# Patient Record
Sex: Female | Born: 1955
Health system: Southern US, Community
[De-identification: ages and names within clinical notes are randomized; demographics above are authoritative.]

## PROBLEM LIST (undated history)

## (undated) ENCOUNTER — Emergency Department: Discharge status: Absent without leave | Payer: Self-pay

## (undated) ENCOUNTER — Inpatient Hospital Stay: Admission: EM | Payer: Self-pay | Source: Home / Self Care

## (undated) DIAGNOSIS — E785 Hyperlipidemia, unspecified: Secondary | ICD-10-CM

## (undated) DIAGNOSIS — F329 Major depressive disorder, single episode, unspecified: Secondary | ICD-10-CM

## (undated) DIAGNOSIS — F32A Depression, unspecified: Secondary | ICD-10-CM

## (undated) HISTORY — DX: Major depressive disorder, single episode, unspecified: F32.9

## (undated) HISTORY — DX: Depression, unspecified: F32.A

## (undated) HISTORY — PX: TONSILLECTOMY AND ADENOIDECTOMY: SUR1326

## (undated) HISTORY — DX: Hyperlipidemia, unspecified: E78.5

---

## 2006-04-23 ENCOUNTER — Emergency Department: Payer: Self-pay | Admitting: Emergency Medicine

## 2006-10-13 ENCOUNTER — Ambulatory Visit: Payer: Self-pay

## 2007-10-18 ENCOUNTER — Ambulatory Visit: Payer: Self-pay

## 2008-10-17 ENCOUNTER — Ambulatory Visit: Payer: Self-pay

## 2009-10-18 ENCOUNTER — Ambulatory Visit: Payer: Self-pay | Admitting: Internal Medicine

## 2010-12-10 ENCOUNTER — Ambulatory Visit: Payer: Self-pay

## 2010-12-24 ENCOUNTER — Ambulatory Visit: Payer: Self-pay

## 2012-01-06 ENCOUNTER — Ambulatory Visit: Payer: Self-pay | Admitting: Family Medicine

## 2013-03-29 ENCOUNTER — Ambulatory Visit: Payer: Self-pay

## 2014-05-23 ENCOUNTER — Ambulatory Visit: Payer: Self-pay

## 2014-06-04 ENCOUNTER — Ambulatory Visit: Payer: Self-pay

## 2014-06-04 ENCOUNTER — Encounter: Payer: Self-pay | Admitting: *Deleted

## 2014-06-12 ENCOUNTER — Ambulatory Visit (INDEPENDENT_AMBULATORY_CARE_PROVIDER_SITE_OTHER): Payer: PRIVATE HEALTH INSURANCE | Admitting: General Surgery

## 2014-06-12 ENCOUNTER — Encounter: Payer: Self-pay | Admitting: General Surgery

## 2014-06-12 VITALS — BP 130/74 | HR 76 | Resp 14 | Ht 61.0 in | Wt 192.0 lb

## 2014-06-12 DIAGNOSIS — D1730 Benign lipomatous neoplasm of skin and subcutaneous tissue of unspecified sites: Secondary | ICD-10-CM

## 2014-06-12 NOTE — Progress Notes (Signed)
Patient ID: Abigail Williams, female   DOB: 09-Apr-1956, 59 y.o.   MRN: [id number]  Chief Complaint  Patient presents with  . Other    mammogram    HPI Abigail Williams is a 59 y.o. female who presents for a breast evaluation. The most recent mammogram and right breast ultrasound was done on 06/04/14. Patient states she felt a spot in her right beast at 11:00 about two months ago. She states the area has got smaller and does not feel it now.  Patient does perform regular self breast checks and gets regular mammograms done.    HPI  Past Medical History  Diagnosis Date  . Hyperlipidemia   . Depression     Past Surgical History  Procedure Laterality Date  . Tonsillectomy and adenoidectomy  age 46    Family History  Problem Relation Age of Onset  . Liver cancer Sister 57    Social History History  Substance Use Topics  . Smoking status: Never Smoker   . Smokeless tobacco: Not on file  . Alcohol Use: 0.0 oz/week    0 Not specified per week    No Known Allergies  Current Outpatient Prescriptions  Medication Sig Dispense Refill  . Multiple Vitamins-Minerals (MULTIVITAMIN WITH MINERALS) tablet Take 1 tablet by mouth daily.    Marland Kitchen PARoxetine (PAXIL) 30 MG tablet Take 30 mg by mouth daily.    . pravastatin (PRAVACHOL) 20 MG tablet Take 20 mg by mouth daily.     No current facility-administered medications for this visit.    Review of Systems Review of Systems  Constitutional: Negative.   Respiratory: Negative.   Cardiovascular: Negative.     Blood pressure 130/74, pulse 76, resp. rate 14, height 5\' 1"  (1.549 m), weight 192 lb (87.091 kg).  Physical Exam Physical Exam  Constitutional: She is oriented to person, place, and time. She appears well-developed and well-nourished.  Eyes: Conjunctivae are normal. No scleral icterus.  Neck: Neck supple.  Cardiovascular: Normal rate, regular rhythm and normal heart sounds.   Pulmonary/Chest: Right breast exhibits no inverted nipple, no  mass, no nipple discharge, no skin change and no tenderness. Left breast exhibits no inverted nipple, no mass, no nipple discharge, no skin change and no tenderness.  Abdominal: Soft. Bowel sounds are normal. There is no tenderness.  Lymphadenopathy:    She has no cervical adenopathy.    She has no axillary adenopathy.  Neurological: She is alert and oriented to person, place, and time.  Skin: Skin is warm and dry.  5 mm slightly firm nodule 2 cm firm right below the right clavicle subcutaneous.  Data Reviewed Notes, mammogram. Ultrasound reviewed- tiny hyperechoic subcutaneous mass- suggestive of a LIPOMA.  Assessment  Lipoma right anterior chest wall below clavicle. Doe not warrant excision at present. Pt advised to call if it enlarges or causes symptoms. She does not need f/u US unless there is a noteable change.    Plan    Patient to return in four months for recheck.        SANKAR,SEEPLAPUTHUR G 06/12/2014, 2:16 PM

## 2014-11-05 ENCOUNTER — Ambulatory Visit (INDEPENDENT_AMBULATORY_CARE_PROVIDER_SITE_OTHER): Payer: PRIVATE HEALTH INSURANCE | Admitting: General Surgery

## 2014-11-05 ENCOUNTER — Encounter: Payer: Self-pay | Admitting: General Surgery

## 2014-11-05 VITALS — BP 146/72 | HR 74 | Resp 12 | Ht 59.0 in | Wt 186.0 lb

## 2014-11-05 DIAGNOSIS — L237 Allergic contact dermatitis due to plants, except food: Secondary | ICD-10-CM | POA: Diagnosis not present

## 2014-11-05 DIAGNOSIS — D1730 Benign lipomatous neoplasm of skin and subcutaneous tissue of unspecified sites: Secondary | ICD-10-CM

## 2014-11-05 MED ORDER — PREDNISONE 10 MG (21) PO TBPK: 10.0000 mg | ORAL_TABLET | Freq: Every day | ORAL | Status: AC

## 2014-11-05 NOTE — Progress Notes (Signed)
Patient ID: Abigail Williams, female   DOB: 25-Nov-1955, 59 y.o.   MRN: [id number]  Chief Complaint  Patient presents with  . Follow-up    Lipoma    HPI Alfonso Shackett is a 59 y.o. female here today for follow up on lipoma on chest wall. She states she cannot find the lipoma. She does have poison ivy.  HPI  Past Medical History  Diagnosis Date  . Hyperlipidemia   . Depression     Past Surgical History  Procedure Laterality Date  . Tonsillectomy and adenoidectomy  age 50    Family History  Problem Relation Age of Onset  . Liver cancer Sister 70  . Cancer Sister     pancreatic    Social History History  Substance Use Topics  . Smoking status: Never Smoker   . Smokeless tobacco: Not on file  . Alcohol Use: 0.0 oz/week    0 Standard drinks or equivalent per week    No Known Allergies  Current Outpatient Prescriptions  Medication Sig Dispense Refill  . Multiple Vitamins-Minerals (MULTIVITAMIN WITH MINERALS) tablet Take 1 tablet by mouth daily.    Marland Kitchen PARoxetine (PAXIL) 30 MG tablet Take 30 mg by mouth daily.    . pravastatin (PRAVACHOL) 20 MG tablet Take 20 mg by mouth daily.     No current facility-administered medications for this visit.    Review of Systems Review of Systems  Constitutional: Negative.   Respiratory: Negative.   Cardiovascular: Negative.     Blood pressure 146/72, pulse 74, resp. rate 12, height 4\' 11"  (1.499 m), weight 186 lb (84.369 kg).  Physical Exam Physical Exam  Constitutional: She is oriented to person, place, and time. She appears well-developed and well-nourished.  Eyes: Conjunctivae are normal. No scleral icterus.  Neck: Neck supple.  Pulmonary/Chest: Effort normal.  Musculoskeletal:  Previous 5 mm subcutaneous nodule below right clavicle is no longer palpable.   Lymphadenopathy:    She has no cervical adenopathy.  Neurological: She is alert and oriented to person, place, and time.  Skin: Skin is warm and dry. Rash (poison ivy over left  forearm) noted.    Data Reviewed Prior Notes   Assessment    Previous 5 mm subcutaneous nodule below right clavicle is no longer palpable.  Poison Ivy on left forearm     Plan    Rx Prednisone Dose Pack The patient is aware to call back for any questions or concerns.  Follow up as needed      IBB:[id number]  Verlene Mayer 11/05/2014, 10:51 AM

## 2014-11-05 NOTE — Patient Instructions (Signed)
The patient is aware to call back for any questions or concerns. Calling in Rx for prednisone dose pack to pharmacy.

## 2014-11-08 ENCOUNTER — Ambulatory Visit: Payer: PRIVATE HEALTH INSURANCE | Admitting: General Surgery

## 2014-12-10 ENCOUNTER — Other Ambulatory Visit: Payer: Self-pay | Admitting: *Deleted

## 2014-12-10 DIAGNOSIS — N63 Unspecified lump in unspecified breast: Secondary | ICD-10-CM

## 2014-12-12 ENCOUNTER — Ambulatory Visit
Admission: RE | Admit: 2014-12-12 | Discharge: 2014-12-12 | Disposition: A | Discharge status: Home / Self Care | Payer: Self-pay | Source: Ambulatory Visit | Attending: Oncology | Admitting: Oncology

## 2014-12-12 DIAGNOSIS — N63 Unspecified lump in unspecified breast: Secondary | ICD-10-CM

## 2014-12-13 ENCOUNTER — Encounter: Payer: Self-pay | Admitting: *Deleted

## 2014-12-13 NOTE — Progress Notes (Signed)
Letter mailed from the [facility] to inform patient of her normal ultrasound results.  Patient is to follow-up with annual screening in six months.

## 2015-06-10 ENCOUNTER — Ambulatory Visit: Payer: Self-pay | Attending: Oncology | Admitting: *Deleted

## 2015-06-10 ENCOUNTER — Encounter: Payer: Self-pay | Admitting: *Deleted

## 2015-06-10 ENCOUNTER — Encounter (INDEPENDENT_AMBULATORY_CARE_PROVIDER_SITE_OTHER): Payer: Self-pay

## 2015-06-10 VITALS — BP 122/77 | HR 87 | Temp 97.0°F | Ht 63.0 in | Wt 184.0 lb

## 2015-06-10 DIAGNOSIS — N63 Unspecified lump in unspecified breast: Secondary | ICD-10-CM

## 2015-06-10 NOTE — Patient Instructions (Signed)
Gave patient hand-out, Women Staying Healthy, Active and Well from [facility], with education on breast health, pap smears, heart and colon health. 

## 2015-06-10 NOTE — Progress Notes (Signed)
Subjective:     Patient ID: Abigail Williams, female   DOB: 05/08/1956, 60 y.o.   MRN: [id number]  HPI   Review of Systems     Objective:   Physical Exam  Pulmonary/Chest: Right breast exhibits no inverted nipple, no mass, no nipple discharge, no skin change and no tenderness. Left breast exhibits no inverted nipple, no mass, no nipple discharge, no skin change and no tenderness. Breasts are symmetrical.  Scattered areas of keratotic lesions across the breast and chest       Assessment:     60 year old White female returns to [facility] for annual screening.   Patient had a palpable  superficial nodule right breast on physical exam.  Birads 3 mammogram.  Patient was followed by Dr. Jamal Collin and the area of concern was at his 4 month follow up.  No 6 month follow up mammogram completed. On clinical breast exam today there is no palpable finding.  Taught self breast awareness.  Patient has been screened for eligibility.  She does not have any insurance, Medicare or Medicaid.  She also meets financial eligibility.  Hand-out given on the Affordable Care Act.      Plan:     Bilateral diagnostic mammogram and ultrasound ordered per Radiology protocol since birads 3 mammo had no follow-up.  Patient scheduled for 06/25/15 @ 11:20.  Will follow-up per [facility] protocol.

## 2015-06-25 ENCOUNTER — Encounter: Payer: Self-pay | Admitting: *Deleted

## 2015-06-25 ENCOUNTER — Ambulatory Visit
Admission: RE | Admit: 2015-06-25 | Discharge: 2015-06-25 | Disposition: A | Discharge status: Home / Self Care | Payer: Self-pay | Source: Ambulatory Visit | Attending: Oncology | Admitting: Oncology

## 2015-06-25 DIAGNOSIS — N63 Unspecified lump in unspecified breast: Secondary | ICD-10-CM

## 2015-06-25 NOTE — Progress Notes (Signed)
Letter mailed from the [facility] to inform patient of her normal mammogram results.  Patient is to follow-up with annual screening in one year.  HSIS to Christy. 

## 2016-06-24 ENCOUNTER — Encounter: Payer: Self-pay | Admitting: *Deleted

## 2016-06-24 ENCOUNTER — Ambulatory Visit
Admission: RE | Admit: 2016-06-24 | Discharge: 2016-06-24 | Disposition: A | Discharge status: Home / Self Care | Payer: Self-pay | Source: Ambulatory Visit | Attending: Oncology | Admitting: Oncology

## 2016-06-24 ENCOUNTER — Ambulatory Visit: Payer: Self-pay | Attending: Oncology | Admitting: *Deleted

## 2016-06-24 ENCOUNTER — Encounter (INDEPENDENT_AMBULATORY_CARE_PROVIDER_SITE_OTHER): Payer: Self-pay

## 2016-06-24 VITALS — BP 131/84 | HR 82 | Temp 98.2°F | Resp 18 | Ht 59.0 in | Wt 185.1 lb

## 2016-06-24 DIAGNOSIS — Z0000 Encounter for general adult medical examination without abnormal findings: Secondary | ICD-10-CM

## 2016-06-24 NOTE — Progress Notes (Signed)
Subjective:     Patient ID: Abigail Williams, female   DOB: [birth year], 61 y.o.   MRN: [id number]  HPI   Review of Systems     Objective:   Physical Exam  Pulmonary/Chest: Right breast exhibits no inverted nipple, no mass, no nipple discharge, no skin change and no tenderness. Left breast exhibits no inverted nipple, no mass, no nipple discharge, no skin change and no tenderness. Breasts are asymmetrical.  Left breast larger than the right       Assessment:     61 year old White female returns to [facility] for annual screening.  Clinical breast exam unremarkable.  Taught self breast awareness.  Last pap on 05/28/14 was -/-.  Next pap due in 2021.  Patient refused pelvic exam today.  Encouraged pelvic exam next year even without a pap smear.  Patient has been screened for eligibility.  She does not have any insurance, Medicare or Medicaid.  She also meets financial eligibility.  Hand-out given on the Affordable Care Act.    Plan:     Screening mammogram ordered.  Will follow-up per [facility] protocol.

## 2016-06-24 NOTE — Patient Instructions (Signed)
Gave patient hand-out, Women Staying Healthy, Active and Well from [facility], with education on breast health, pap smears, heart and colon health. 

## 2016-07-01 ENCOUNTER — Encounter: Payer: Self-pay | Admitting: *Deleted

## 2016-07-01 NOTE — Progress Notes (Signed)
Letter mailed from the [facility] to inform patient of her normal mammogram results.  Patient is to follow-up with annual screening in one year.  HSIS to Christy. 

## 2017-07-14 ENCOUNTER — Ambulatory Visit
Admission: RE | Admit: 2017-07-14 | Discharge: 2017-07-14 | Disposition: A | Discharge status: Home / Self Care | Payer: Self-pay | Source: Ambulatory Visit | Attending: Oncology | Admitting: Oncology

## 2017-07-14 ENCOUNTER — Ambulatory Visit: Payer: Self-pay | Attending: Oncology | Admitting: *Deleted

## 2017-07-14 VITALS — BP 118/75 | HR 70 | Temp 98.4°F | Ht 61.0 in | Wt 184.0 lb

## 2017-07-14 DIAGNOSIS — Z0000 Encounter for general adult medical examination without abnormal findings: Secondary | ICD-10-CM

## 2017-07-14 NOTE — Patient Instructions (Signed)
Gave patient hand-out, Women Staying Healthy, Active and Well from [facility], with education on breast health, pap smears, heart and colon health. 

## 2017-07-14 NOTE — Progress Notes (Signed)
Subjective:     Patient ID: Abigail Williams, female   DOB: April 23, 1956, 62 y.o.   MRN: [id number]  HPI   Review of Systems     Objective:   Physical Exam  Pulmonary/Chest: Right breast exhibits no inverted nipple, no mass, no nipple discharge, no skin change and no tenderness. Left breast exhibits no inverted nipple, no mass, no nipple discharge, no skin change and no tenderness. Breasts are symmetrical.         Assessment:     62 year old White female returns to [facility] for annual screening.  Clinical breast exam unremarkable.  Taught self breast awareness.  Last pap on 05/23/14 was negative / negative.  See results in [place].  Next pap in 2021. Patient has been screened for eligibility.  She does not have any insurance, Medicare or Medicaid.  She also meets financial eligibility.  Hand-out given on the Affordable Care Act.    Plan:     Screening mammogram ordered.  Will follow-up per [facility] protocol.

## 2017-08-03 ENCOUNTER — Encounter: Payer: Self-pay | Admitting: *Deleted

## 2017-08-03 NOTE — Progress Notes (Signed)
Letter mailed from the [facility] to inform patient of her normal mammogram results.  Patient is to follow-up with annual screening in one year.  HSIS to Christy. 

## 2018-07-20 ENCOUNTER — Encounter (INDEPENDENT_AMBULATORY_CARE_PROVIDER_SITE_OTHER): Payer: Self-pay

## 2018-07-20 ENCOUNTER — Ambulatory Visit: Payer: Self-pay | Attending: Oncology | Admitting: *Deleted

## 2018-07-20 ENCOUNTER — Ambulatory Visit
Admission: RE | Admit: 2018-07-20 | Discharge: 2018-07-20 | Disposition: A | Discharge status: Home / Self Care | Payer: Self-pay | Source: Ambulatory Visit | Attending: Oncology | Admitting: Oncology

## 2018-07-20 ENCOUNTER — Other Ambulatory Visit: Payer: Self-pay

## 2018-07-20 ENCOUNTER — Encounter: Payer: Self-pay | Admitting: *Deleted

## 2018-07-20 VITALS — BP 134/84 | HR 87 | Temp 98.4°F | Ht 60.0 in | Wt 187.6 lb

## 2018-07-20 DIAGNOSIS — Z0000 Encounter for general adult medical examination without abnormal findings: Secondary | ICD-10-CM

## 2018-07-20 NOTE — Patient Instructions (Signed)
Gave patient hand-out, Women Staying Healthy, Active and Well from [facility], with education on breast health, pap smears, heart and colon health. 

## 2018-07-20 NOTE — Progress Notes (Signed)
  Subjective:     Patient ID: Abigail Williams, female   DOB: [birth year], 63 y.o.   MRN: [id number]  HPI   Review of Systems     Objective:   Physical Exam Chest:     Breasts:        Right: No swelling, bleeding, inverted nipple, mass, nipple discharge, skin change or tenderness.        Left: No swelling, bleeding, inverted nipple, mass, nipple discharge, skin change or tenderness.    Lymphadenopathy:     Upper Body:     Right upper body: No supraclavicular or axillary adenopathy.     Left upper body: No supraclavicular or axillary adenopathy.        Assessment:     63 year old White female presents to [facility] for annual screening.  Clinical breast exam unremarkable.  Taught self breast awareness.  Last pap on 05/23/14 was negative / negative.  Next pap due in 2021.  Patient will be 63 at that time.  Recommended she schedule a pap with her primary care provider.  Patient has been screened for eligibility.  She does not have any insurance, Medicare or Medicaid.  She also meets financial eligibility.  Hand-out given on the Affordable Care Act.   Risk Assessment    Risk Scores      07/20/2018   Last edited by: Orson Slick, CMA   5-year risk: 2 %   Lifetime risk: 7.9 %            Plan:     Screening mammogram ordered.  Will follow-up per [facility] protocol.

## 2018-07-25 ENCOUNTER — Encounter: Payer: Self-pay | Admitting: *Deleted

## 2018-07-25 NOTE — Progress Notes (Signed)
Letter mailed from the [facility] to inform patient of her normal mammogram results.  Patient is to follow-up with annual screening in one year.  HSIS to Christy. 

## 2018-09-20 DIAGNOSIS — E785 Hyperlipidemia, unspecified: Secondary | ICD-10-CM | POA: Diagnosis not present

## 2018-09-26 DIAGNOSIS — F3342 Major depressive disorder, recurrent, in full remission: Secondary | ICD-10-CM | POA: Diagnosis not present

## 2018-09-26 DIAGNOSIS — E785 Hyperlipidemia, unspecified: Secondary | ICD-10-CM | POA: Diagnosis not present

## 2018-12-08 DIAGNOSIS — H524 Presbyopia: Secondary | ICD-10-CM | POA: Diagnosis not present

## 2018-12-08 DIAGNOSIS — H353131 Nonexudative age-related macular degeneration, bilateral, early dry stage: Secondary | ICD-10-CM | POA: Diagnosis not present

## 2019-01-24 DIAGNOSIS — E785 Hyperlipidemia, unspecified: Secondary | ICD-10-CM | POA: Diagnosis not present

## 2019-01-24 DIAGNOSIS — F3342 Major depressive disorder, recurrent, in full remission: Secondary | ICD-10-CM | POA: Diagnosis not present

## 2019-02-08 DIAGNOSIS — Z0000 Encounter for general adult medical examination without abnormal findings: Secondary | ICD-10-CM | POA: Diagnosis not present

## 2019-02-08 DIAGNOSIS — E785 Hyperlipidemia, unspecified: Secondary | ICD-10-CM | POA: Diagnosis not present

## 2019-02-08 DIAGNOSIS — Z780 Asymptomatic menopausal state: Secondary | ICD-10-CM | POA: Diagnosis not present

## 2019-02-08 DIAGNOSIS — Z1159 Encounter for screening for other viral diseases: Secondary | ICD-10-CM | POA: Diagnosis not present

## 2019-02-08 DIAGNOSIS — F3342 Major depressive disorder, recurrent, in full remission: Secondary | ICD-10-CM | POA: Diagnosis not present

## 2019-02-08 DIAGNOSIS — Z8249 Family history of ischemic heart disease and other diseases of the circulatory system: Secondary | ICD-10-CM | POA: Diagnosis not present

## 2019-02-08 DIAGNOSIS — Z1211 Encounter for screening for malignant neoplasm of colon: Secondary | ICD-10-CM | POA: Diagnosis not present

## 2019-02-09 ENCOUNTER — Other Ambulatory Visit: Payer: Self-pay | Admitting: Family Medicine

## 2019-02-09 DIAGNOSIS — Z8249 Family history of ischemic heart disease and other diseases of the circulatory system: Secondary | ICD-10-CM

## 2019-02-09 DIAGNOSIS — Z0000 Encounter for general adult medical examination without abnormal findings: Secondary | ICD-10-CM

## 2019-02-09 DIAGNOSIS — M810 Age-related osteoporosis without current pathological fracture: Secondary | ICD-10-CM | POA: Diagnosis not present

## 2019-02-16 ENCOUNTER — Ambulatory Visit: Payer: Self-pay

## 2019-02-23 DIAGNOSIS — Z1211 Encounter for screening for malignant neoplasm of colon: Secondary | ICD-10-CM | POA: Diagnosis not present

## 2019-04-21 DIAGNOSIS — M810 Age-related osteoporosis without current pathological fracture: Secondary | ICD-10-CM | POA: Diagnosis not present

## 2019-06-14 ENCOUNTER — Other Ambulatory Visit: Payer: Self-pay | Admitting: Family Medicine

## 2019-06-14 DIAGNOSIS — Z1231 Encounter for screening mammogram for malignant neoplasm of breast: Secondary | ICD-10-CM

## 2019-06-28 DIAGNOSIS — H25813 Combined forms of age-related cataract, bilateral: Secondary | ICD-10-CM | POA: Diagnosis not present

## 2019-06-28 DIAGNOSIS — H353131 Nonexudative age-related macular degeneration, bilateral, early dry stage: Secondary | ICD-10-CM | POA: Diagnosis not present

## 2019-06-28 DIAGNOSIS — H524 Presbyopia: Secondary | ICD-10-CM | POA: Diagnosis not present

## 2019-07-21 ENCOUNTER — Ambulatory Visit
Admission: RE | Admit: 2019-07-21 | Discharge: 2019-07-21 | Disposition: A | Discharge status: Home / Self Care | Payer: Medicare HMO | Source: Ambulatory Visit | Attending: Family Medicine | Admitting: Family Medicine

## 2019-07-21 DIAGNOSIS — Z1231 Encounter for screening mammogram for malignant neoplasm of breast: Secondary | ICD-10-CM | POA: Diagnosis not present

## 2019-10-16 ENCOUNTER — Other Ambulatory Visit: Payer: Self-pay

## 2019-10-16 ENCOUNTER — Ambulatory Visit (INDEPENDENT_AMBULATORY_CARE_PROVIDER_SITE_OTHER): Payer: Medicare HMO | Admitting: Dermatology

## 2019-10-16 DIAGNOSIS — Z1283 Encounter for screening for malignant neoplasm of skin: Secondary | ICD-10-CM

## 2019-10-16 DIAGNOSIS — L820 Inflamed seborrheic keratosis: Secondary | ICD-10-CM | POA: Diagnosis not present

## 2019-10-16 DIAGNOSIS — L821 Other seborrheic keratosis: Secondary | ICD-10-CM

## 2019-10-16 DIAGNOSIS — D2261 Melanocytic nevi of right upper limb, including shoulder: Secondary | ICD-10-CM | POA: Diagnosis not present

## 2019-10-16 DIAGNOSIS — L578 Other skin changes due to chronic exposure to nonionizing radiation: Secondary | ICD-10-CM

## 2019-10-16 DIAGNOSIS — L304 Erythema intertrigo: Secondary | ICD-10-CM | POA: Diagnosis not present

## 2019-10-16 DIAGNOSIS — D229 Melanocytic nevi, unspecified: Secondary | ICD-10-CM

## 2019-10-16 MED ORDER — KETOCONAZOLE 2 % EX CREA: TOPICAL_CREAM | CUTANEOUS | 1 refills | Status: AC

## 2019-10-16 MED ORDER — HYDROCORTISONE 2.5 % EX CREA: TOPICAL_CREAM | CUTANEOUS | 1 refills | Status: AC

## 2019-10-16 NOTE — Patient Instructions (Addendum)
Cryotherapy Aftercare   Wash gently with soap and water everyday.    Apply Vaseline and Band-Aid daily until healed.   Intertrigo Mix hydrocortisone with ketaconazole 2% twice a day to affected areas groin. If improved, decrease to hydrocortisone and ketaconazole mixed once a day. If still clear, decrease to ketaconazole only.   Seborrheic Keratosis  What causes seborrheic keratoses? Seborrheic keratoses are harmless, common skin growths that first appear during adult life.  As time goes by, more growths appear.  Some people may develop a large number of them.  Seborrheic keratoses appear on both covered and uncovered body parts.  They are not caused by sunlight.  The tendency to develop seborrheic keratoses can be inherited.  They vary in color from skin-colored to gray, brown, or even black.  They can be either smooth or have a rough, warty surface.   Seborrheic keratoses are superficial and look as if they were stuck on the skin.  Under the microscope this type of keratosis looks like layers upon layers of skin.  That is why at times the top layer may seem to fall off, but the rest of the growth remains and re-grows.    Treatment Seborrheic keratoses do not need to be treated, but can easily be removed in the office.  Seborrheic keratoses often cause symptoms when they rub on clothing or jewelry.  Lesions can be in the way of shaving.  If they become inflamed, they can cause itching, soreness, or burning.  Removal of a seborrheic keratosis can be accomplished by freezing, burning, or surgery. If any spot bleeds, scabs, or grows rapidly, please return to have it checked, as these can be an indication of a skin cancer.   Halog Solution - Apply a few drops to itchy spots in scalp 1-2 times a day until improved. Avoid face.  Recommend Neutrogena T-Sal shampoo - let sit several minutes before rinsing.

## 2019-10-16 NOTE — Progress Notes (Signed)
° °  New Patient Visit  Subjective  Abigail Williams is a 64 y.o. female who presents for the following: TBSE. She has a few itchy spots in her groin she would like treated. She also has an itchy scalp, which is normally worse in the winter. She is using Denorex Shampoo, which helps, but is still having some trouble.  She has itchy areas under her breasts.   The following portions of the chart were reviewed this encounter and updated as appropriate:      Review of Systems:  No other skin or systemic complaints except as noted in HPI or Assessment and Plan.  Objective  Well appearing patient in no apparent distress; mood and affect are within normal limits.  A full examination was performed including scalp, head, eyes, ears, nose, lips, neck, chest, axillae, abdomen, back, buttocks, bilateral upper extremities, bilateral lower extremities, hands, feet, fingers, toes, fingernails, and toenails. All findings within normal limits unless otherwise noted below.  Objective  Bilateral Inguinal Crease: Mild erythema and scale.  Objective  Right Lateral Calf x 1, R lower neck x 5, R upper chest x 1, R inframammary x 7, L inframammary x 9, vertex scalp x 7, Left abdomen x 1 (31): Erythematous keratotic or waxy stuck-on papule or plaque.   Objective  Top of Right Shoulder: 5.57mm brown macule   Assessment & Plan   Skin cancer screening performed today.  Actinic Damage - diffuse scaly erythematous macules with underlying dyspigmentation - Recommend daily broad spectrum sunscreen SPF 30+ to sun-exposed areas, reapply every 2 hours as needed.  - Call for new or changing lesions. Seborrheic Keratoses - Stuck-on, waxy, tan-brown papules and plaques  - Discussed benign etiology and prognosis. - Observe - Call for any changes   Erythema intertrigo Bilateral Inguinal Crease  Start Ketoconazole 2% Cream Apply qd/bid AAs as directed prn itch 1Rf. Start 2.5% Hydrocortisone Cream Apply qd/bid AAs as  directed prn itch 1Rf.  hydrocortisone 2.5 % cream - Bilateral Inguinal Crease  ketoconazole (NIZORAL) 2 % cream - Bilateral Inguinal Crease  Inflamed seborrheic keratosis (31) Right Lateral Calf x 1, R lower neck x 5, R upper chest x 1, R inframammary x 7, L inframammary x 9, vertex scalp x 7, Left abdomen x 1  Halog Solution Apply to AAs scalp qd/bid prn itch. Sample given. Recommend T-Sal shampoo, let sit several minutes before rinsing.  Destruction of lesion - Right Lateral Calf x 1, R lower neck x 5, R upper chest x 1, R inframammary x 7, L inframammary x 9, vertex scalp x 7, Left abdomen x 1  Destruction method: cryotherapy   Informed consent: discussed and consent obtained   Lesion destroyed using liquid nitrogen: Yes   Region frozen until ice ball extended beyond lesion: Yes   Outcome: patient tolerated procedure well with no complications   Post-procedure details: wound care instructions given    Nevus Top of Right Shoulder  Vs SK  Benign-appearing.  Observation.  Call clinic for new or changing moles.  Recommend daily use of broad spectrum spf 30+ sunscreen to sun-exposed areas.    Return in about 3 months (around 01/16/2020) for ISKs.   IJamesetta Orleans, CMA, am acting as scribe for Brendolyn Patty, MD .  Documentation: I have reviewed the above documentation for accuracy and completeness, and I agree with the above.  Brendolyn Patty MD

## 2019-10-31 DIAGNOSIS — M810 Age-related osteoporosis without current pathological fracture: Secondary | ICD-10-CM | POA: Diagnosis not present

## 2019-12-19 DIAGNOSIS — H353131 Nonexudative age-related macular degeneration, bilateral, early dry stage: Secondary | ICD-10-CM | POA: Diagnosis not present

## 2020-01-22 ENCOUNTER — Ambulatory Visit (INDEPENDENT_AMBULATORY_CARE_PROVIDER_SITE_OTHER): Payer: Medicare HMO | Admitting: Dermatology

## 2020-01-22 ENCOUNTER — Other Ambulatory Visit: Payer: Self-pay

## 2020-01-22 DIAGNOSIS — L304 Erythema intertrigo: Secondary | ICD-10-CM

## 2020-01-22 DIAGNOSIS — L820 Inflamed seborrheic keratosis: Secondary | ICD-10-CM | POA: Diagnosis not present

## 2020-01-22 NOTE — Progress Notes (Signed)
   Follow-Up Visit   Subjective  Abigail Williams is a 64 y.o. female who presents for the following: Seborrheic Keratosis (3 months f/u ISK treated with LN2 with a good response, pt c/o irritated areas on the Left chest today ).  Intertrigo rash is also improved using Ketoconazole cream and HC cream.   The following portions of the chart were reviewed this encounter and updated as appropriate:      Review of Systems:  No other skin or systemic complaints except as noted in HPI or Assessment and Plan.  Objective  Well appearing patient in no apparent distress; mood and affect are within normal limits.  A focused examination was performed including face,chest, neck . Relevant physical exam findings are noted in the Assessment and Plan.  Objective  Left chest (2): Erythematous keratotic or waxy stuck-on papule L chest inframammary area clear  Objective  groin area: Improved per patient, no longer red    Assessment & Plan  Inflamed seborrheic keratosis (2) Left chest  Destruction of lesion - Left chest  Destruction method: cryotherapy   Informed consent: discussed and consent obtained   Timeout:  patient name, date of birth, surgical site, and procedure verified Lesion destroyed using liquid nitrogen: Yes   Region frozen until ice ball extended beyond lesion: Yes   Outcome: patient tolerated procedure well with no complications   Post-procedure details: wound care instructions given    Erythema intertrigo groin area  improved Cont Ketoconazole cream qd-bid   Cont Hydrocortisone cream qd-bid prn flares only  Other Related Medications hydrocortisone 2.5 % cream ketoconazole (NIZORAL) 2 % cream  Return if symptoms worsen or fail to improve.   I, Marye Round, CMA, am acting as scribe for Brendolyn Patty, MD .  Documentation: I have reviewed the above documentation for accuracy and completeness, and I agree with the above.  Brendolyn Patty MD

## 2020-01-22 NOTE — Patient Instructions (Addendum)
Seborrheic Keratosis  What causes seborrheic keratoses? Seborrheic keratoses are harmless, common skin growths that first appear during adult life.  As time goes by, more growths appear.  Some people may develop a large number of them.  Seborrheic keratoses appear on both covered and uncovered body parts.  They are not caused by sunlight.  The tendency to develop seborrheic keratoses can be inherited.  They vary in color from skin-colored to gray, brown, or even black.  They can be either smooth or have a rough, warty surface.   Seborrheic keratoses are superficial and look as if they were stuck on the skin.  Under the microscope this type of keratosis looks like layers upon layers of skin.  That is why at times the top layer may seem to fall off, but the rest of the growth remains and re-grows.    Treatment Seborrheic keratoses do not need to be treated, but can easily be removed in the office.  Seborrheic keratoses often cause symptoms when they rub on clothing or jewelry.  Lesions can be in the way of shaving.  If they become inflamed, they can cause itching, soreness, or burning.  Removal of a seborrheic keratosis can be accomplished by freezing, burning, or surgery. If any spot bleeds, scabs, or grows rapidly, please return to have it checked, as these can be an indication of a skin cancer.   Cryotherapy Aftercare  Wash gently with soap and water everyday.   Apply Vaseline and Band-Aid daily until healed.  

## 2020-02-05 DIAGNOSIS — E785 Hyperlipidemia, unspecified: Secondary | ICD-10-CM | POA: Diagnosis not present

## 2020-02-12 DIAGNOSIS — R058 Other specified cough: Secondary | ICD-10-CM | POA: Diagnosis not present

## 2020-02-12 DIAGNOSIS — R911 Solitary pulmonary nodule: Secondary | ICD-10-CM | POA: Diagnosis not present

## 2020-02-12 DIAGNOSIS — F3342 Major depressive disorder, recurrent, in full remission: Secondary | ICD-10-CM | POA: Diagnosis not present

## 2020-02-12 DIAGNOSIS — R059 Cough, unspecified: Secondary | ICD-10-CM | POA: Diagnosis not present

## 2020-02-12 DIAGNOSIS — Z0000 Encounter for general adult medical examination without abnormal findings: Secondary | ICD-10-CM | POA: Diagnosis not present

## 2020-02-12 DIAGNOSIS — E785 Hyperlipidemia, unspecified: Secondary | ICD-10-CM | POA: Diagnosis not present

## 2020-02-19 DIAGNOSIS — Z1211 Encounter for screening for malignant neoplasm of colon: Secondary | ICD-10-CM | POA: Diagnosis not present

## 2020-04-11 DIAGNOSIS — Z01419 Encounter for gynecological examination (general) (routine) without abnormal findings: Secondary | ICD-10-CM | POA: Diagnosis not present

## 2020-04-11 DIAGNOSIS — Z124 Encounter for screening for malignant neoplasm of cervix: Secondary | ICD-10-CM | POA: Diagnosis not present

## 2020-04-16 DIAGNOSIS — L409 Psoriasis, unspecified: Secondary | ICD-10-CM | POA: Diagnosis not present

## 2020-06-14 ENCOUNTER — Other Ambulatory Visit: Payer: Self-pay | Admitting: Family Medicine

## 2020-06-14 DIAGNOSIS — Z1231 Encounter for screening mammogram for malignant neoplasm of breast: Secondary | ICD-10-CM

## 2020-07-16 DIAGNOSIS — H524 Presbyopia: Secondary | ICD-10-CM | POA: Diagnosis not present

## 2020-07-16 DIAGNOSIS — H353131 Nonexudative age-related macular degeneration, bilateral, early dry stage: Secondary | ICD-10-CM | POA: Diagnosis not present

## 2020-07-22 ENCOUNTER — Ambulatory Visit
Admission: RE | Admit: 2020-07-22 | Discharge: 2020-07-22 | Disposition: A | Discharge status: Home / Self Care | Payer: Medicare HMO | Source: Ambulatory Visit | Attending: Family Medicine | Admitting: Family Medicine

## 2020-07-22 ENCOUNTER — Other Ambulatory Visit: Payer: Self-pay

## 2020-07-22 DIAGNOSIS — Z1231 Encounter for screening mammogram for malignant neoplasm of breast: Secondary | ICD-10-CM | POA: Insufficient documentation

## 2020-09-10 IMAGING — MG DIGITAL SCREENING BILAT W/ TOMO W/ CAD
6 of 10 series · 6 of 30 positions shown · non-contrast
Comparison: Previous exam(s).

CLINICAL DATA: Screening.

EXAM:
DIGITAL SCREENING BILATERAL MAMMOGRAM WITH TOMO AND CAD

[L MLO synth-2D (1 of 2)]
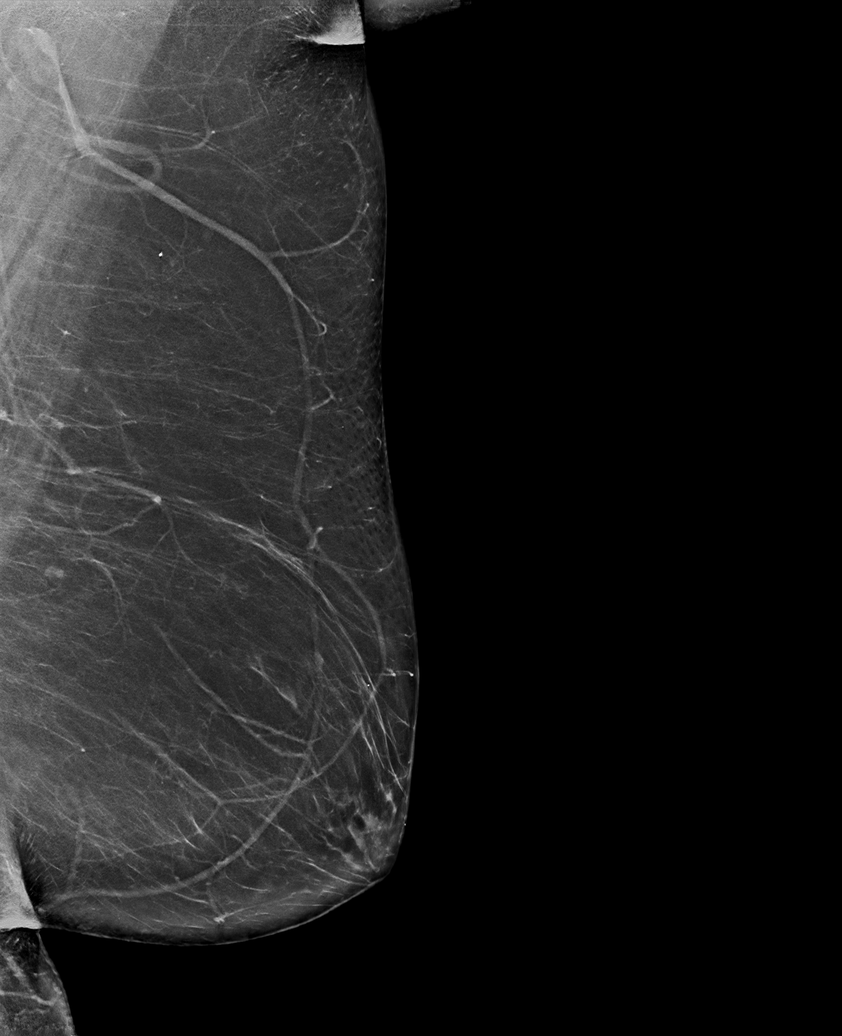

[L CC synth-2D]
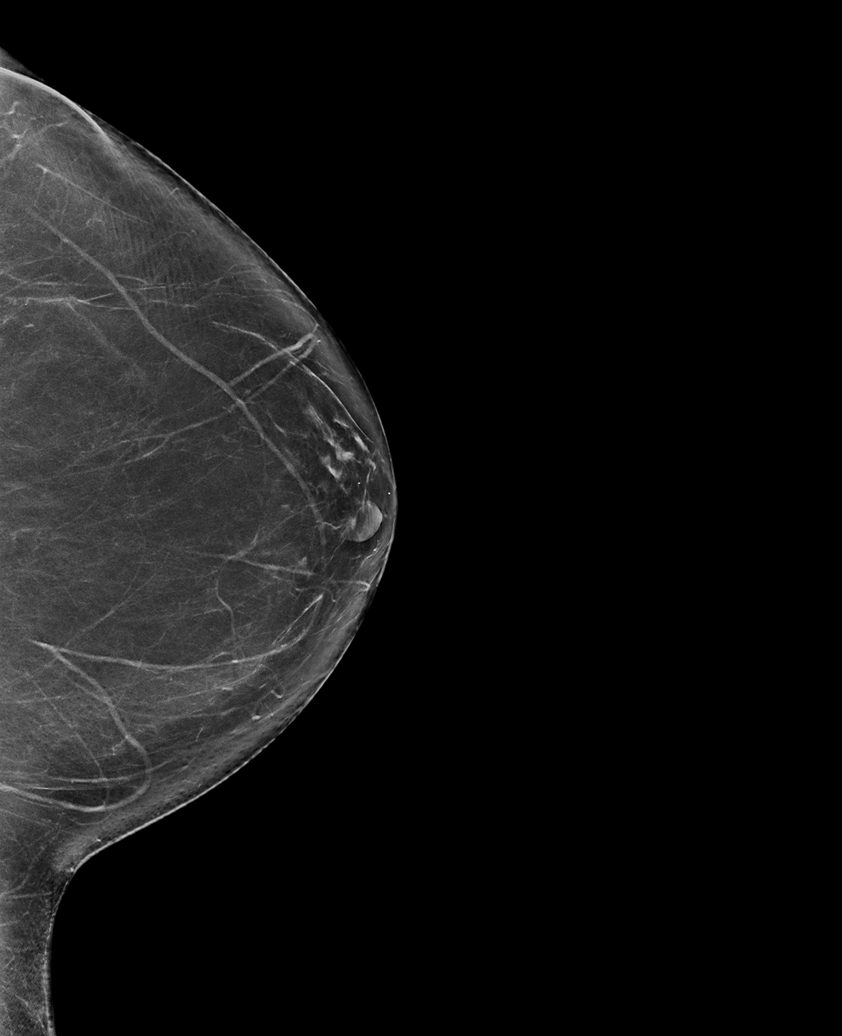

[R MLO synth-2D]
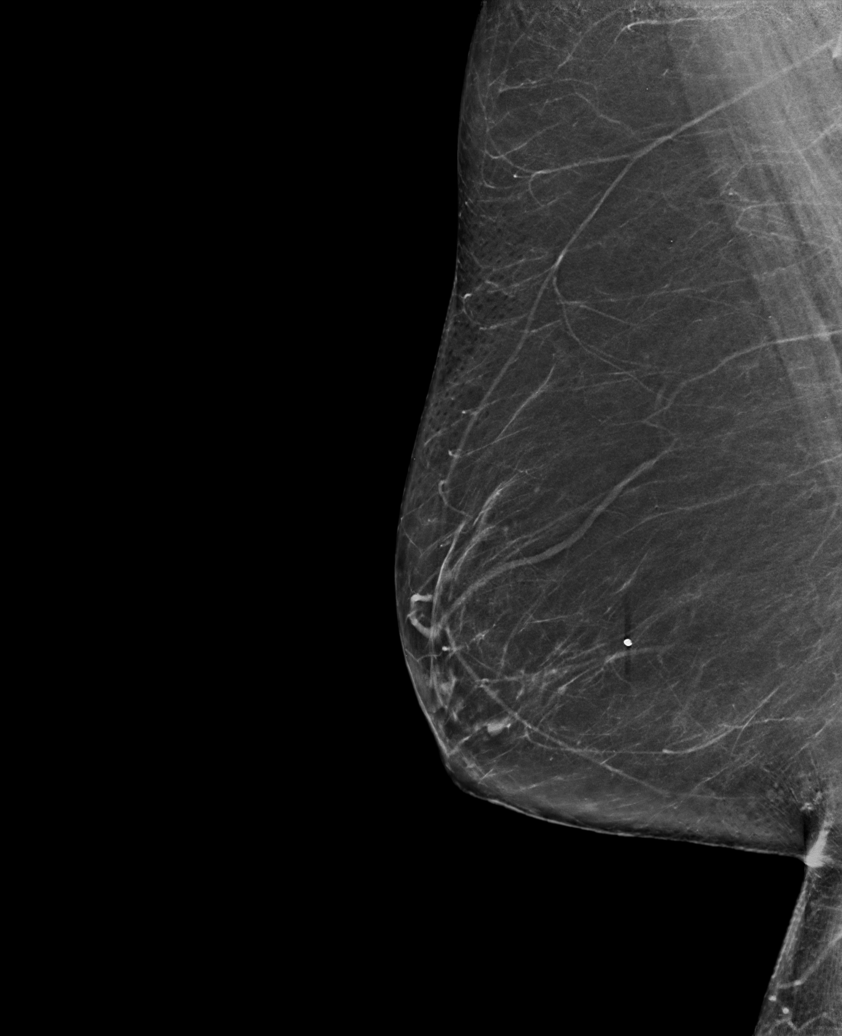

[R CC synth-2D]
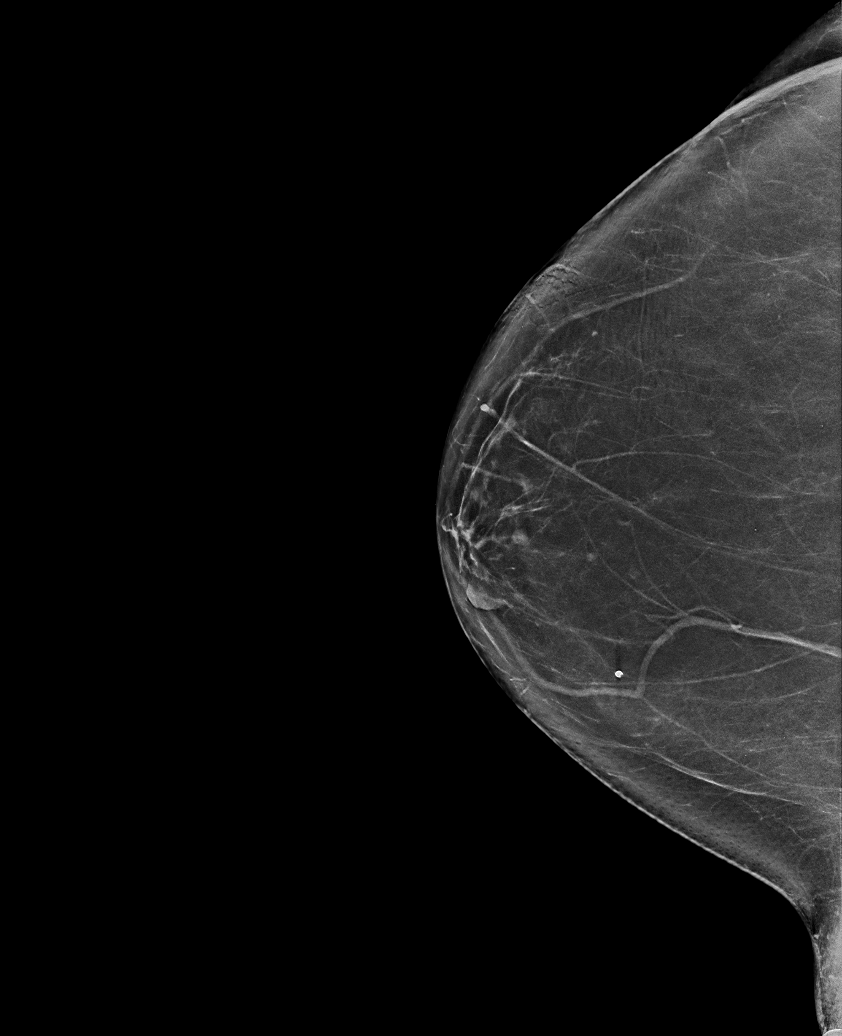

[L MLO synth-2D (2 of 2)]
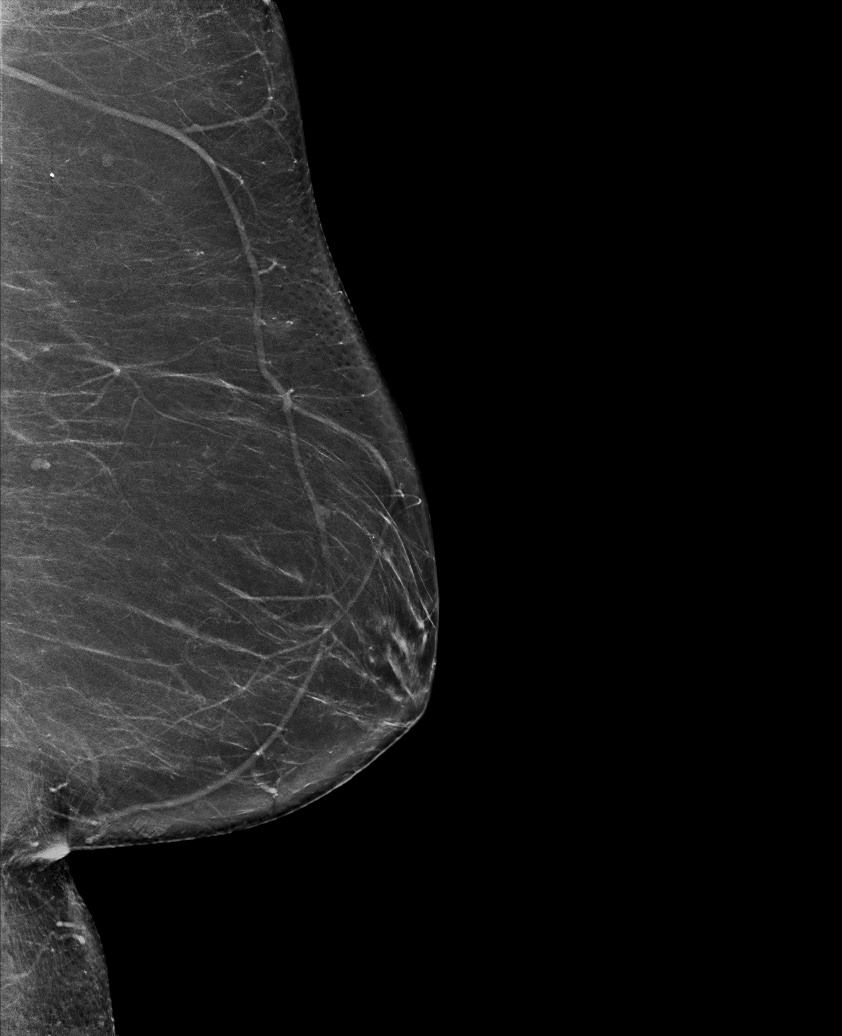

[R CC tomo · tomo slice 37/73.0]
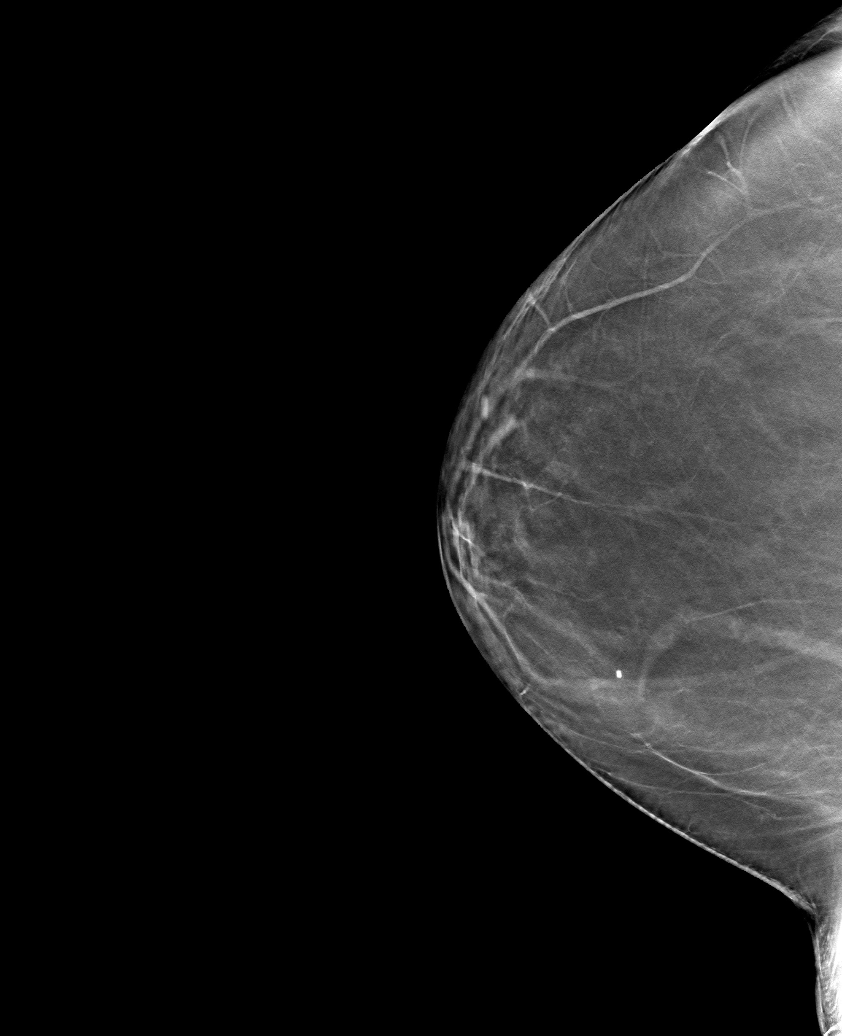

[6 of 30 positions shown; findings below may reference images not displayed]

ACR Breast Density Category b: There are scattered areas of
fibroglandular density.
FINDINGS: There are no findings suspicious for malignancy. Images were
processed with CAD.
IMPRESSION: No mammographic evidence of malignancy. A result letter of this
screening mammogram will be mailed directly to the patient.

RECOMMENDATION:
Screening mammogram in one year. (Code:[id number])

BI-RADS CATEGORY  1: Negative.

## 2020-10-18 DIAGNOSIS — Q782 Osteopetrosis: Secondary | ICD-10-CM | POA: Diagnosis not present

## 2021-01-23 DIAGNOSIS — H353131 Nonexudative age-related macular degeneration, bilateral, early dry stage: Secondary | ICD-10-CM | POA: Diagnosis not present

## 2021-02-05 DIAGNOSIS — Z0000 Encounter for general adult medical examination without abnormal findings: Secondary | ICD-10-CM | POA: Diagnosis not present

## 2021-02-05 DIAGNOSIS — E785 Hyperlipidemia, unspecified: Secondary | ICD-10-CM | POA: Diagnosis not present

## 2021-02-12 DIAGNOSIS — M810 Age-related osteoporosis without current pathological fracture: Secondary | ICD-10-CM | POA: Diagnosis not present

## 2021-02-12 DIAGNOSIS — R911 Solitary pulmonary nodule: Secondary | ICD-10-CM | POA: Diagnosis not present

## 2021-02-12 DIAGNOSIS — F3342 Major depressive disorder, recurrent, in full remission: Secondary | ICD-10-CM | POA: Diagnosis not present

## 2021-02-12 DIAGNOSIS — E785 Hyperlipidemia, unspecified: Secondary | ICD-10-CM | POA: Diagnosis not present

## 2021-02-12 DIAGNOSIS — Z0000 Encounter for general adult medical examination without abnormal findings: Secondary | ICD-10-CM | POA: Diagnosis not present

## 2021-02-13 DIAGNOSIS — R911 Solitary pulmonary nodule: Secondary | ICD-10-CM | POA: Diagnosis not present

## 2021-02-13 DIAGNOSIS — J984 Other disorders of lung: Secondary | ICD-10-CM | POA: Diagnosis not present

## 2021-02-20 ENCOUNTER — Other Ambulatory Visit: Payer: Self-pay | Admitting: Family Medicine

## 2021-02-20 DIAGNOSIS — R911 Solitary pulmonary nodule: Secondary | ICD-10-CM

## 2021-03-10 ENCOUNTER — Other Ambulatory Visit: Payer: Self-pay

## 2021-03-10 ENCOUNTER — Ambulatory Visit (INDEPENDENT_AMBULATORY_CARE_PROVIDER_SITE_OTHER): Payer: Medicare HMO | Admitting: Dermatology

## 2021-03-10 DIAGNOSIS — L820 Inflamed seborrheic keratosis: Secondary | ICD-10-CM | POA: Diagnosis not present

## 2021-03-10 DIAGNOSIS — D2261 Melanocytic nevi of right upper limb, including shoulder: Secondary | ICD-10-CM | POA: Diagnosis not present

## 2021-03-10 DIAGNOSIS — D229 Melanocytic nevi, unspecified: Secondary | ICD-10-CM

## 2021-03-10 DIAGNOSIS — L821 Other seborrheic keratosis: Secondary | ICD-10-CM

## 2021-03-10 NOTE — Patient Instructions (Addendum)

## 2021-03-10 NOTE — Progress Notes (Signed)
   Follow-Up Visit   Subjective  Abigail Williams is a 65 y.o. female who presents for the following: check spot (Back, yrs, itchy). She has had other ISKs frozen off and they have gone away.    The following portions of the chart were reviewed this encounter and updated as appropriate:       Review of Systems:  No other skin or systemic complaints except as noted in HPI or Assessment and Plan.  Objective  Well appearing patient in no apparent distress; mood and affect are within normal limits.  A focused examination was performed including back, right shoulder. Relevant physical exam findings are noted in the Assessment and Plan.  L lat mid back at braline x 1, Total = 1 Erythematous keratotic or waxy stuck-on papule or plaque.   Top of R shoulder 5.18mm tan macule slightly waxy   Assessment & Plan   Seborrheic Keratoses - Stuck-on, waxy, tan-brown papules and/or plaques  - Benign-appearing - Discussed benign etiology and prognosis. - Observe - Call for any changes  Inflamed seborrheic keratosis L lat mid back at braline x 1, Total = 1  Destruction of lesion - L lat mid back at braline x 1, Total = 1  Destruction method: cryotherapy   Informed consent: discussed and consent obtained   Lesion destroyed using liquid nitrogen: Yes   Region frozen until ice ball extended beyond lesion: Yes   Outcome: patient tolerated procedure well with no complications   Post-procedure details: wound care instructions given   Additional details:  Prior to procedure, discussed risks of blister formation, small wound, skin dyspigmentation, or rare scar following cryotherapy. Recommend Vaseline ointment to treated areas while healing.   Nevus Top of R shoulder  Vs SK  Benign appearing, observe.  RTC if changes  Recommend daily broad spectrum sunscreen SPF 30+ to sun-exposed areas, reapply every 2 hours as needed. Call for new or changing lesions.  Staying in the shade or wearing long  sleeves, sun glasses (UVA+UVB protection) and wide brim hats (4-inch brim around the entire circumference of the hat) are also recommended for sun protection.    Return if symptoms worsen or fail to improve.  I, Othelia Pulling, RMA, am acting as scribe for Brendolyn Patty, MD .  Documentation: I have reviewed the above documentation for accuracy and completeness, and I agree with the above.  Brendolyn Patty MD

## 2021-03-11 ENCOUNTER — Ambulatory Visit
Admission: RE | Admit: 2021-03-11 | Discharge: 2021-03-11 | Disposition: A | Discharge status: Home / Self Care | Payer: Medicare HMO | Source: Ambulatory Visit | Attending: Family Medicine | Admitting: Family Medicine

## 2021-03-11 ENCOUNTER — Other Ambulatory Visit: Payer: Self-pay

## 2021-03-11 DIAGNOSIS — R918 Other nonspecific abnormal finding of lung field: Secondary | ICD-10-CM | POA: Diagnosis not present

## 2021-03-11 DIAGNOSIS — R911 Solitary pulmonary nodule: Secondary | ICD-10-CM | POA: Insufficient documentation

## 2021-03-11 DIAGNOSIS — J9811 Atelectasis: Secondary | ICD-10-CM | POA: Diagnosis not present

## 2021-04-17 DIAGNOSIS — M810 Age-related osteoporosis without current pathological fracture: Secondary | ICD-10-CM | POA: Diagnosis not present

## 2021-05-30 DIAGNOSIS — M778 Other enthesopathies, not elsewhere classified: Secondary | ICD-10-CM | POA: Diagnosis not present

## 2021-05-30 DIAGNOSIS — L600 Ingrowing nail: Secondary | ICD-10-CM | POA: Diagnosis not present

## 2021-05-30 DIAGNOSIS — Z780 Asymptomatic menopausal state: Secondary | ICD-10-CM | POA: Diagnosis not present

## 2021-07-01 DIAGNOSIS — L259 Unspecified contact dermatitis, unspecified cause: Secondary | ICD-10-CM | POA: Diagnosis not present

## 2021-07-17 DIAGNOSIS — H353131 Nonexudative age-related macular degeneration, bilateral, early dry stage: Secondary | ICD-10-CM | POA: Diagnosis not present

## 2021-07-21 DIAGNOSIS — E785 Hyperlipidemia, unspecified: Secondary | ICD-10-CM | POA: Diagnosis not present

## 2021-09-09 ENCOUNTER — Other Ambulatory Visit: Payer: Self-pay | Admitting: Family Medicine

## 2021-09-09 DIAGNOSIS — Z1231 Encounter for screening mammogram for malignant neoplasm of breast: Secondary | ICD-10-CM

## 2021-10-01 DIAGNOSIS — R5383 Other fatigue: Secondary | ICD-10-CM | POA: Diagnosis not present

## 2021-10-01 DIAGNOSIS — M810 Age-related osteoporosis without current pathological fracture: Secondary | ICD-10-CM | POA: Diagnosis not present

## 2021-10-08 ENCOUNTER — Ambulatory Visit
Admission: RE | Admit: 2021-10-08 | Discharge: 2021-10-08 | Disposition: A | Discharge status: Home / Self Care | Payer: Medicare HMO | Source: Ambulatory Visit | Attending: Family Medicine | Admitting: Family Medicine

## 2021-10-08 DIAGNOSIS — Z1231 Encounter for screening mammogram for malignant neoplasm of breast: Secondary | ICD-10-CM | POA: Insufficient documentation

## 2021-10-10 DIAGNOSIS — L409 Psoriasis, unspecified: Secondary | ICD-10-CM | POA: Diagnosis not present

## 2022-02-26 ENCOUNTER — Other Ambulatory Visit: Payer: Self-pay | Admitting: Family Medicine

## 2022-02-26 DIAGNOSIS — Z8249 Family history of ischemic heart disease and other diseases of the circulatory system: Secondary | ICD-10-CM

## 2022-03-02 ENCOUNTER — Ambulatory Visit
Admission: RE | Admit: 2022-03-02 | Discharge: 2022-03-02 | Disposition: A | Discharge status: Home / Self Care | Payer: No Typology Code available for payment source | Source: Ambulatory Visit | Attending: Family Medicine | Admitting: Family Medicine

## 2022-03-02 DIAGNOSIS — Z8249 Family history of ischemic heart disease and other diseases of the circulatory system: Secondary | ICD-10-CM

## 2022-03-06 ENCOUNTER — Other Ambulatory Visit: Payer: Medicare HMO

## 2022-03-11 ENCOUNTER — Other Ambulatory Visit: Payer: Self-pay | Admitting: Family Medicine

## 2022-03-11 DIAGNOSIS — R9389 Abnormal findings on diagnostic imaging of other specified body structures: Secondary | ICD-10-CM

## 2022-03-11 DIAGNOSIS — R911 Solitary pulmonary nodule: Secondary | ICD-10-CM

## 2022-03-26 ENCOUNTER — Ambulatory Visit
Admission: RE | Admit: 2022-03-26 | Discharge: 2022-03-26 | Disposition: A | Discharge status: Home / Self Care | Payer: Medicare Other | Source: Ambulatory Visit | Attending: Family Medicine | Admitting: Family Medicine

## 2022-03-26 DIAGNOSIS — R9389 Abnormal findings on diagnostic imaging of other specified body structures: Secondary | ICD-10-CM

## 2022-03-26 DIAGNOSIS — R911 Solitary pulmonary nodule: Secondary | ICD-10-CM | POA: Diagnosis present

## 2022-07-14 DIAGNOSIS — L255 Unspecified contact dermatitis due to plants, except food: Secondary | ICD-10-CM | POA: Diagnosis not present

## 2022-07-23 DIAGNOSIS — H353131 Nonexudative age-related macular degeneration, bilateral, early dry stage: Secondary | ICD-10-CM | POA: Diagnosis not present

## 2022-07-23 DIAGNOSIS — H524 Presbyopia: Secondary | ICD-10-CM | POA: Diagnosis not present

## 2022-07-27 DIAGNOSIS — I2510 Atherosclerotic heart disease of native coronary artery without angina pectoris: Secondary | ICD-10-CM | POA: Diagnosis not present

## 2022-07-27 DIAGNOSIS — E7800 Pure hypercholesterolemia, unspecified: Secondary | ICD-10-CM | POA: Diagnosis not present

## 2022-07-27 DIAGNOSIS — I2584 Coronary atherosclerosis due to calcified coronary lesion: Secondary | ICD-10-CM | POA: Diagnosis not present

## 2022-08-11 DIAGNOSIS — H903 Sensorineural hearing loss, bilateral: Secondary | ICD-10-CM | POA: Diagnosis not present

## 2022-08-14 DIAGNOSIS — H903 Sensorineural hearing loss, bilateral: Secondary | ICD-10-CM | POA: Diagnosis not present

## 2022-08-25 ENCOUNTER — Other Ambulatory Visit: Payer: Self-pay | Admitting: Family Medicine

## 2022-08-25 DIAGNOSIS — Z1231 Encounter for screening mammogram for malignant neoplasm of breast: Secondary | ICD-10-CM

## 2022-09-02 DIAGNOSIS — R300 Dysuria: Secondary | ICD-10-CM | POA: Diagnosis not present

## 2022-10-12 ENCOUNTER — Ambulatory Visit
Admission: RE | Admit: 2022-10-12 | Discharge: 2022-10-12 | Disposition: A | Discharge status: Home / Self Care | Payer: Medicare HMO | Source: Ambulatory Visit | Attending: Family Medicine | Admitting: Family Medicine

## 2022-10-12 DIAGNOSIS — Z1231 Encounter for screening mammogram for malignant neoplasm of breast: Secondary | ICD-10-CM | POA: Diagnosis not present

## 2022-10-16 DIAGNOSIS — L409 Psoriasis, unspecified: Secondary | ICD-10-CM | POA: Diagnosis not present

## 2023-01-21 DIAGNOSIS — H353131 Nonexudative age-related macular degeneration, bilateral, early dry stage: Secondary | ICD-10-CM | POA: Diagnosis not present

## 2023-03-10 DIAGNOSIS — E559 Vitamin D deficiency, unspecified: Secondary | ICD-10-CM | POA: Diagnosis not present

## 2023-03-10 DIAGNOSIS — Z0000 Encounter for general adult medical examination without abnormal findings: Secondary | ICD-10-CM | POA: Diagnosis not present

## 2023-03-10 DIAGNOSIS — E785 Hyperlipidemia, unspecified: Secondary | ICD-10-CM | POA: Diagnosis not present

## 2023-03-17 DIAGNOSIS — Z1331 Encounter for screening for depression: Secondary | ICD-10-CM | POA: Diagnosis not present

## 2023-03-17 DIAGNOSIS — E785 Hyperlipidemia, unspecified: Secondary | ICD-10-CM | POA: Diagnosis not present

## 2023-03-17 DIAGNOSIS — F419 Anxiety disorder, unspecified: Secondary | ICD-10-CM | POA: Diagnosis not present

## 2023-03-17 DIAGNOSIS — F332 Major depressive disorder, recurrent severe without psychotic features: Secondary | ICD-10-CM | POA: Diagnosis not present

## 2023-03-17 DIAGNOSIS — Z0000 Encounter for general adult medical examination without abnormal findings: Secondary | ICD-10-CM | POA: Diagnosis not present

## 2023-03-17 DIAGNOSIS — G4700 Insomnia, unspecified: Secondary | ICD-10-CM | POA: Diagnosis not present

## 2023-04-14 DIAGNOSIS — F332 Major depressive disorder, recurrent severe without psychotic features: Secondary | ICD-10-CM | POA: Diagnosis not present

## 2023-04-16 DIAGNOSIS — M810 Age-related osteoporosis without current pathological fracture: Secondary | ICD-10-CM | POA: Diagnosis not present

## 2023-08-12 ENCOUNTER — Other Ambulatory Visit: Payer: Self-pay | Admitting: Family Medicine

## 2023-08-12 DIAGNOSIS — Z1231 Encounter for screening mammogram for malignant neoplasm of breast: Secondary | ICD-10-CM

## 2023-10-13 ENCOUNTER — Ambulatory Visit
Admission: RE | Admit: 2023-10-13 | Discharge: 2023-10-13 | Disposition: A | Discharge status: Home / Self Care | Source: Ambulatory Visit | Attending: Family Medicine | Admitting: Family Medicine

## 2023-10-13 DIAGNOSIS — Z1231 Encounter for screening mammogram for malignant neoplasm of breast: Secondary | ICD-10-CM | POA: Insufficient documentation
